# Patient Record
Sex: Male | Born: 1995 | Race: White | Hispanic: No | Marital: Single | Smoking: Never smoker
Health system: Southern US, Community
[De-identification: ages and names within clinical notes are randomized; demographics above are authoritative.]

## PROBLEM LIST (undated history)

## (undated) DIAGNOSIS — F32A Depression, unspecified: Secondary | ICD-10-CM

## (undated) DIAGNOSIS — F99 Mental disorder, not otherwise specified: Secondary | ICD-10-CM

## (undated) DIAGNOSIS — F329 Major depressive disorder, single episode, unspecified: Secondary | ICD-10-CM

---
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
## 2015-12-09 ENCOUNTER — Ambulatory Visit (HOSPITAL_COMMUNITY)
Admission: EM | Admit: 2015-12-09 | Discharge: 2015-12-09 | Disposition: A | Discharge status: Home / Self Care | Payer: Self-pay | Attending: Emergency Medicine | Admitting: Emergency Medicine

## 2015-12-09 ENCOUNTER — Encounter (HOSPITAL_COMMUNITY)
Admission: EM | Disposition: A | Discharge status: Home / Self Care | Payer: Self-pay | Source: Home / Self Care | Attending: Emergency Medicine

## 2015-12-09 ENCOUNTER — Emergency Department (HOSPITAL_COMMUNITY): Payer: Self-pay

## 2015-12-09 ENCOUNTER — Emergency Department (HOSPITAL_COMMUNITY): Payer: Self-pay | Admitting: Certified Registered Nurse Anesthetist

## 2015-12-09 ENCOUNTER — Encounter (HOSPITAL_COMMUNITY): Payer: Self-pay

## 2015-12-09 DIAGNOSIS — Z23 Encounter for immunization: Secondary | ICD-10-CM | POA: Insufficient documentation

## 2015-12-09 DIAGNOSIS — S62633B Displaced fracture of distal phalanx of left middle finger, initial encounter for open fracture: Secondary | ICD-10-CM | POA: Insufficient documentation

## 2015-12-09 DIAGNOSIS — W010XXA Fall on same level from slipping, tripping and stumbling without subsequent striking against object, initial encounter: Secondary | ICD-10-CM | POA: Insufficient documentation

## 2015-12-09 DIAGNOSIS — Y990 Civilian activity done for income or pay: Secondary | ICD-10-CM | POA: Insufficient documentation

## 2015-12-09 DIAGNOSIS — S6992XA Unspecified injury of left wrist, hand and finger(s), initial encounter: Secondary | ICD-10-CM

## 2015-12-09 HISTORY — PX: OPEN REDUCTION INTERNAL FIXATION (ORIF) METACARPAL: SHX6234

## 2015-12-09 SURGERY — OPEN REDUCTION INTERNAL FIXATION (ORIF) METACARPAL
Anesthesia: General | Site: Finger | Laterality: Left

## 2015-12-09 MED ORDER — FENTANYL CITRATE (PF) 100 MCG/2ML IJ SOLN: 25.0000 ug | INTRAMUSCULAR | Status: DC | PRN

## 2015-12-09 MED ORDER — MIDAZOLAM HCL 2 MG/2ML IJ SOLN
INTRAMUSCULAR | Status: AC
  Filled 2015-12-09: qty 2

## 2015-12-09 MED ORDER — LACTATED RINGERS IV SOLN
INTRAVENOUS | Status: DC | PRN
  Administered 2015-12-09 (×2): via INTRAVENOUS

## 2015-12-09 MED ORDER — CEFAZOLIN IN D5W 1 GM/50ML IV SOLN
1.0000 g | Freq: Once | INTRAVENOUS | Status: AC
  Administered 2015-12-09: 1 g via INTRAVENOUS
  Filled 2015-12-09: qty 50

## 2015-12-09 MED ORDER — OXYCODONE-ACETAMINOPHEN 5-325 MG PO TABS: 1.0000 | ORAL_TABLET | Freq: Three times a day (TID) | ORAL | 0 refills | Status: AC

## 2015-12-09 MED ORDER — MIDAZOLAM HCL 5 MG/5ML IJ SOLN
INTRAMUSCULAR | Status: DC | PRN
  Administered 2015-12-09: 2 mg via INTRAVENOUS

## 2015-12-09 MED ORDER — SUCCINYLCHOLINE CHLORIDE 20 MG/ML IJ SOLN
INTRAMUSCULAR | Status: DC | PRN
  Administered 2015-12-09: 140 mg via INTRAVENOUS

## 2015-12-09 MED ORDER — PROPOFOL 10 MG/ML IV BOLUS
INTRAVENOUS | Status: AC
  Filled 2015-12-09: qty 20

## 2015-12-09 MED ORDER — OXYCODONE HCL 5 MG/5ML PO SOLN: 5.0000 mg | Freq: Once | ORAL | Status: DC | PRN

## 2015-12-09 MED ORDER — ONDANSETRON HCL 4 MG/2ML IJ SOLN
INTRAMUSCULAR | Status: DC | PRN
  Administered 2015-12-09: 4 mg via INTRAVENOUS

## 2015-12-09 MED ORDER — TETANUS-DIPHTH-ACELL PERTUSSIS 5-2.5-18.5 LF-MCG/0.5 IM SUSP
0.5000 mL | Freq: Once | INTRAMUSCULAR | Status: AC
  Administered 2015-12-09: 0.5 mL via INTRAMUSCULAR
  Filled 2015-12-09: qty 0.5

## 2015-12-09 MED ORDER — FENTANYL CITRATE (PF) 100 MCG/2ML IJ SOLN
INTRAMUSCULAR | Status: AC
  Filled 2015-12-09: qty 4

## 2015-12-09 MED ORDER — 0.9 % SODIUM CHLORIDE (POUR BTL) OPTIME
TOPICAL | Status: DC | PRN
  Administered 2015-12-09: 1000 mL

## 2015-12-09 MED ORDER — CEPHALEXIN 500 MG PO CAPS: 500.0000 mg | ORAL_CAPSULE | Freq: Four times a day (QID) | ORAL | 0 refills | Status: AC

## 2015-12-09 MED ORDER — DEXAMETHASONE SODIUM PHOSPHATE 4 MG/ML IJ SOLN
INTRAMUSCULAR | Status: DC | PRN
  Administered 2015-12-09: 10 mg via INTRAVENOUS

## 2015-12-09 MED ORDER — DOCUSATE SODIUM 100 MG PO CAPS: 100.0000 mg | ORAL_CAPSULE | Freq: Two times a day (BID) | ORAL | 0 refills | Status: AC

## 2015-12-09 MED ORDER — LIDOCAINE HCL (CARDIAC) 20 MG/ML IV SOLN
INTRAVENOUS | Status: DC | PRN
  Administered 2015-12-09: 40 mg via INTRAVENOUS

## 2015-12-09 MED ORDER — HYDROMORPHONE HCL 2 MG/ML IJ SOLN
1.0000 mg | Freq: Once | INTRAMUSCULAR | Status: AC
  Administered 2015-12-09: 1 mg via INTRAVENOUS
  Filled 2015-12-09: qty 1

## 2015-12-09 MED ORDER — FENTANYL CITRATE (PF) 100 MCG/2ML IJ SOLN
INTRAMUSCULAR | Status: DC | PRN
  Administered 2015-12-09: 150 ug via INTRAVENOUS
  Administered 2015-12-09: 50 ug via INTRAVENOUS

## 2015-12-09 MED ORDER — ONDANSETRON HCL 4 MG/2ML IJ SOLN: 4.0000 mg | Freq: Once | INTRAMUSCULAR | Status: DC | PRN

## 2015-12-09 MED ORDER — PHENYLEPHRINE HCL 10 MG/ML IJ SOLN
INTRAMUSCULAR | Status: DC | PRN
  Administered 2015-12-09 (×4): 80 ug via INTRAVENOUS

## 2015-12-09 MED ORDER — BUPIVACAINE HCL (PF) 0.25 % IJ SOLN
INTRAMUSCULAR | Status: AC
  Filled 2015-12-09: qty 30

## 2015-12-09 MED ORDER — BUPIVACAINE HCL (PF) 0.25 % IJ SOLN
INTRAMUSCULAR | Status: DC | PRN
  Administered 2015-12-09: 6 mL

## 2015-12-09 MED ORDER — CEFAZOLIN SODIUM-DEXTROSE 2-3 GM-% IV SOLR
INTRAVENOUS | Status: DC | PRN
  Administered 2015-12-09: 2 g via INTRAVENOUS

## 2015-12-09 MED ORDER — OXYCODONE HCL 5 MG PO TABS: 5.0000 mg | ORAL_TABLET | Freq: Once | ORAL | Status: DC | PRN

## 2015-12-09 SURGICAL SUPPLY — 52 items
BANDAGE ACE 4X5 VEL STRL LF (GAUZE/BANDAGES/DRESSINGS) ×3 IMPLANT
BANDAGE ELASTIC 3 VELCRO ST LF (GAUZE/BANDAGES/DRESSINGS) ×3 IMPLANT
BNDG COHESIVE 1X5 TAN STRL LF (GAUZE/BANDAGES/DRESSINGS) ×3 IMPLANT
BNDG CONFORM 2 STRL LF (GAUZE/BANDAGES/DRESSINGS) ×3 IMPLANT
BNDG ESMARK 4X9 LF (GAUZE/BANDAGES/DRESSINGS) ×3 IMPLANT
BNDG GAUZE ELAST 4 BULKY (GAUZE/BANDAGES/DRESSINGS) ×3 IMPLANT
CORDS BIPOLAR (ELECTRODE) ×3 IMPLANT
COVER SURGICAL LIGHT HANDLE (MISCELLANEOUS) ×3 IMPLANT
CUFF TOURNIQUET SINGLE 18IN (TOURNIQUET CUFF) ×3 IMPLANT
DRAIN TLS ROUND 10FR (DRAIN) IMPLANT
DRAPE OEC MINIVIEW 54X84 (DRAPES) ×3 IMPLANT
DRAPE SURG 17X11 SM STRL (DRAPES) ×3 IMPLANT
DRSG ADAPTIC 3X8 NADH LF (GAUZE/BANDAGES/DRESSINGS) ×3 IMPLANT
DRSG EMULSION OIL 3X3 NADH (GAUZE/BANDAGES/DRESSINGS) ×3 IMPLANT
GAUZE SPONGE 4X4 12PLY STRL (GAUZE/BANDAGES/DRESSINGS) ×3 IMPLANT
GAUZE SPONGE 4X4 16PLY XRAY LF (GAUZE/BANDAGES/DRESSINGS) IMPLANT
GAUZE XEROFORM 1X8 LF (GAUZE/BANDAGES/DRESSINGS) ×3 IMPLANT
GLOVE BIO SURGEON STRL SZ7 (GLOVE) ×3 IMPLANT
GLOVE BIOGEL PI IND STRL 6.5 (GLOVE) ×1 IMPLANT
GLOVE BIOGEL PI IND STRL 8.5 (GLOVE) ×1 IMPLANT
GLOVE BIOGEL PI INDICATOR 6.5 (GLOVE) ×2
GLOVE BIOGEL PI INDICATOR 8.5 (GLOVE) ×2
GLOVE SURG ORTHO 8.0 STRL STRW (GLOVE) ×3 IMPLANT
GOWN STRL REUS W/ TWL LRG LVL3 (GOWN DISPOSABLE) ×3 IMPLANT
GOWN STRL REUS W/ TWL XL LVL3 (GOWN DISPOSABLE) ×1 IMPLANT
GOWN STRL REUS W/TWL LRG LVL3 (GOWN DISPOSABLE) ×6
GOWN STRL REUS W/TWL XL LVL3 (GOWN DISPOSABLE) ×2
K-WIRE 1.1 (WIRE) ×3 IMPLANT
KIT BASIN OR (CUSTOM PROCEDURE TRAY) ×3 IMPLANT
KIT ROOM TURNOVER OR (KITS) ×3 IMPLANT
KWIRE 1.1 (Wire) ×3 IMPLANT
MANIFOLD NEPTUNE II (INSTRUMENTS) ×3 IMPLANT
NEEDLE HYPO 25GX1X1/2 BEV (NEEDLE) ×3 IMPLANT
NEEDLE HYPO 25X1 1.5 SAFETY (NEEDLE) ×3 IMPLANT
NS IRRIG 1000ML POUR BTL (IV SOLUTION) ×3 IMPLANT
PACK ORTHO EXTREMITY (CUSTOM PROCEDURE TRAY) ×3 IMPLANT
PAD ARMBOARD 7.5X6 YLW CONV (MISCELLANEOUS) ×6 IMPLANT
PAD CAST 4YDX4 CTTN HI CHSV (CAST SUPPLIES) ×1 IMPLANT
PADDING CAST COTTON 4X4 STRL (CAST SUPPLIES) ×2
SOAP 2 % CHG 4 OZ (WOUND CARE) ×3 IMPLANT
SPLINT FINGER (SOFTGOODS) ×3 IMPLANT
SUT CHROMIC 5 0 P 3 (SUTURE) ×6 IMPLANT
SUT ETHILON 4 0 PS 2 18 (SUTURE) IMPLANT
SUT MNCRL AB 4-0 PS2 18 (SUTURE) IMPLANT
SUT VIC AB 2-0 FS1 27 (SUTURE) IMPLANT
SUT VICRYL 4-0 PS2 18IN ABS (SUTURE) IMPLANT
SYR CONTROL 10ML LL (SYRINGE) ×3 IMPLANT
SYSTEM CHEST DRAIN TLS 7FR (DRAIN) IMPLANT
TOWEL OR 17X24 6PK STRL BLUE (TOWEL DISPOSABLE) ×3 IMPLANT
TUBE CONNECTING 12'X1/4 (SUCTIONS) ×1
TUBE CONNECTING 12X1/4 (SUCTIONS) ×2 IMPLANT
YANKAUER SUCT BULB TIP NO VENT (SUCTIONS) IMPLANT

## 2015-12-09 NOTE — Discharge Instructions (Signed)
[facility]EEP [facility] CALL OFFICE FOR F/U APPT [phone] ALSO CALL FOR THERAPY APPT IN 7 DAYS [phone] EXT 1601 KEEP HAND ELEVATED ABOVE HEART OK TO APPLY ICE TO OPERATIVE AREA CONTACT OFFICE IF ANY WORSENING PAIN OR CONCERNS.

## 2015-12-09 NOTE — Anesthesia Preprocedure Evaluation (Signed)
Anesthesia Evaluation  Patient identified by MRN, date of birth, ID band Patient awake    Reviewed: Allergy & Precautions, NPO status , Patient's Chart, lab work & pertinent test results  Airway Mallampati: II  TM Distance: >3 FB Neck ROM: Full    Dental  (+) Teeth Intact, Dental Advisory Given   Pulmonary    breath sounds clear to auscultation       Cardiovascular  Rhythm:Regular Rate:Normal     Neuro/Psych    GI/Hepatic   Endo/Other    Renal/GU      Musculoskeletal   Abdominal   Peds  Hematology   Anesthesia Other Findings   Reproductive/Obstetrics                             Anesthesia Physical Anesthesia Plan  ASA: II and emergent  Anesthesia Plan: General   Post-op Pain Management:    Induction: Intravenous, Rapid sequence and Cricoid pressure planned  Airway Management Planned: Oral ETT  Additional Equipment:   Intra-op Plan:   Post-operative Plan: Extubation in OR  Informed Consent: I have reviewed the patients History and Physical, chart, labs and discussed the procedure including the risks, benefits and alternatives for the proposed anesthesia with the patient or authorized representative who has indicated his/her understanding and acceptance.   Dental advisory given  Plan Discussed with: CRNA and Anesthesiologist  Anesthesia Plan Comments:         Anesthesia Quick Evaluation

## 2015-12-09 NOTE — Transfer of Care (Signed)
Immediate Anesthesia Transfer of Care Note  Patient: Jose Young  Procedure(s) Performed: Procedure(s): OPEN REDUCTION INTERNAL FIXATION (ORIF) LONG FINGER VS AMPUTATION (Left)  Patient Location: PACU  Anesthesia Type:General  Level of Consciousness: awake, alert  and oriented  Airway & Oxygen Therapy: Patient Spontanous Breathing and Patient connected to nasal cannula oxygen  Post-op Assessment: Report given to RN and Post -op Vital signs reviewed and stable  Post vital signs: Reviewed and stable  Last Vitals:  Vitals:   12/09/15 1856 12/09/15 1857  BP: (!) 126/59   Pulse:    Resp:    Temp:  37.5 C    Last Pain: There were no vitals filed for this visit.       Complications: No apparent anesthesia complications

## 2015-12-09 NOTE — H&P (Signed)
Jose Young is an 20 y.o. male.   Chief Complaint: LEFT LONG FINGER INJURY HPI: PT WAS WORKING SUSTAINED LEFT LONG FINGER INJURY PRESENTED TO ED WITH LEFT LONG FINGER OPEN DISTAL TIP INJURY PT HERE FOR SURGERY NO PRIOR SURGERY TO LEFT LONG FINGER  Past Medical History:  Diagnosis Date  . Depression   . Mental disorder     History reviewed. No pertinent surgical history.  History reviewed. No pertinent family history. Social History:  reports that he has never smoked. He has never used smokeless tobacco. He reports that he does not drink alcohol. His drug history is not on file.  Allergies: No Known Allergies   (Not in a hospital admission)  No results found for this or any previous visit (from the past 48 hour(s)). Dg Hand Complete Left  Result Date: 12/09/2015 CLINICAL DATA:  Left hand injury at work. EXAM: LEFT HAND - COMPLETE 3+ VIEW COMPARISON:  None. FINDINGS: Comminuted and moderately angulated fracture is seen involving the third distal phalanx. Soft tissue laceration cannot be excluded. Possible mildly displaced avulsion fracture involving volar aspect of proximal base of third middle phalanx. Joint spaces are intact. No radiopaque foreign body is noted. IMPRESSION: Comminuted and moderately angulated third distal phalangeal fracture. Possible mildly displaced avulsion fracture involving volar aspect of proximal base of third middle phalanx. Electronically Signed   By: Lupita RaiderJames  Green Jr, M.D.   On: 12/09/2015 15:54    ROS: NO RECENT ILLNESSES OR HOSPITALIZATIONS  Blood pressure 131/81, pulse 110, resp. rate 17, SpO2 99 %. Physical Exam  General Appearance:  Alert, cooperative, no distress, appears stated age  Head:  Normocephalic, without obvious abnormality, atraumatic  Eyes:  Pupils equal, conjunctiva/corneas clear,         Throat: Lips, mucosa, and tongue normal; teeth and gums normal  Neck: No visible masses     Lungs:   respirations unlabored  Chest Wall:  No  tenderness or deformity  Heart:  Regular rate and rhythm,  Abdomen:   Soft, non-tender,         Extremities: LEFT LONG FINGER FINGER TIP PERFUSED OPEN DISTAL TUFT INJURY WITH EXPOSED BONE UNABLE TO FLEX DIP JOINT ABLE TO FLEX PIP JOINT SOFT TISSUE ABRASIONS TO DORSUM OF LONG AND INDEX FINGER NO WOUNDS TO THUMB/RING/SMALL GOOD WRIST AND FOREARM MOTION  Pulses: 2+ and symmetric  Skin: Skin color, texture, turgor normal, no rashes or lesions     Neurologic: Normal    Assessment/Plan LEFT LONG FINGER OPEN DISTAL PHALANX FRACTURE WITH NEAR AMPUTATION THROUGH DISTAL PHALANX  LEFT LONG FINGER OPEN DEBRIDEMENT AND ORIF VERSUS REVISION AMPUTATION  R/B/A DISCUSSED WITH PT IN HOSPITAL.  PT VOICED UNDERSTANDING OF PLAN CONSENT SIGNED DAY OF SURGERY PT SEEN AND EXAMINED PRIOR TO OPERATIVE PROCEDURE/DAY OF SURGERY SITE MARKED. QUESTIONS ANSWERED WILL GO HOME FOLLOWING SURGERY  WE ARE PLANNING SURGERY FOR YOUR UPPER EXTREMITY. THE RISKS AND BENEFITS OF SURGERY INCLUDE BUT NOT LIMITED TO BLEEDING INFECTION, DAMAGE TO NEARBY NERVES ARTERIES TENDONS, FAILURE OF SURGERY TO ACCOMPLISH ITS INTENDED GOALS, PERSISTENT SYMPTOMS AND NEED FOR FURTHER SURGICAL INTERVENTION. WITH THIS IN MIND WE WILL PROCEED. I HAVE DISCUSSED WITH THE PATIENT THE PRE AND POSTOPERATIVE REGIMEN AND THE DOS AND DON'TS. PT VOICED UNDERSTANDING AND INFORMED CONSENT SIGNED.  Sharma CovertORTMANN,Anmol Paschen W 12/09/2015, 5:18 PM

## 2015-12-09 NOTE — ED Provider Notes (Signed)
[facility]-EMERGENCY DEPT Provider Note   CSN: [id number] Arrival date & time: 12/09/15  1432     History   Chief Complaint Chief Complaint  Patient presents with  . Hand Injury    HPI Jose Young is a 20 y.o. male with past medical history of depression who presents to the ED today to be evaluated after a hand injury occurred. 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 This caused a crush injury to his left middle finger. Last tetanus unknown.  HPI  Past Medical History:  Diagnosis Date  . Depression   . Mental disorder     There are no active problems to display for this patient.   No past surgical history on file.     Home Medications    Prior to Admission medications   Medication Sig Start Date End Date Taking? Authorizing Provider  albuterol (PROVENTIL HFA;VENTOLIN HFA) 108 (90 BASE) MCG/ACT inhaler Inhale 2 puffs into the lungs every 6 (six) hours as needed for wheezing or shortness of breath.    Historical Provider, MD  clonazePAM (KLONOPIN) 0.5 MG tablet Take 1 tablet (0.5 mg total) by mouth 2 (two) times daily as needed for anxiety. 02/22/13   Niel Hummeross Kuhner, MD  GLUCOS-MSM-C-MN-GINGER-WILLOW PO Take 2 tablets by mouth every morning.    Historical Provider, MD  ibuprofen (ADVIL,MOTRIN) 200 MG tablet Take 200 mg by mouth every 6 (six) hours as needed for headache.    Historical Provider, MD    Family History No family history on file.  Social History Social History  Substance Use Topics  . Smoking status: Never Smoker  . Smokeless tobacco: Never Used  . Alcohol use No     Allergies   Patient has no known allergies.   Review of Systems Review of Systems  All other systems reviewed and are negative.    Physical Exam Updated Vital Signs There were no vitals taken for this visit.  Physical Exam  Constitutional: He is oriented  to person, place, and time. He appears well-developed and well-nourished. No distress.  HENT:  Head: Normocephalic and atraumatic.  Eyes: Conjunctivae are normal. Right eye exhibits no discharge. Left eye exhibits no discharge. No scleral icterus.  Cardiovascular: Normal rate.   Pulmonary/Chest: Effort normal.  Neurological: He is alert and oriented to person, place, and time. Coordination normal.  Skin: Skin is warm and dry. No rash noted. He is not diaphoretic. No erythema. No pallor.  Psychiatric: He has a normal mood and affect. His behavior is normal.  Nursing note and vitals reviewed.        ED Treatments / Results  Labs (all labs ordered are listed, but only abnormal results are displayed) Labs Reviewed - No data to display  EKG  EKG Interpretation None       Radiology Dg Hand Complete Left  Result Date: 12/09/2015 CLINICAL DATA:  Left hand injury at work. EXAM: LEFT HAND - COMPLETE 3+ VIEW COMPARISON:  None. FINDINGS: Comminuted and moderately angulated fracture is seen involving the third distal phalanx. Soft tissue laceration cannot be excluded. Possible mildly displaced avulsion fracture involving volar aspect of proximal base of third middle phalanx. Joint spaces are intact. No radiopaque foreign body is noted. IMPRESSION: Comminuted and moderately angulated third distal phalangeal fracture. Possible mildly displaced avulsion fracture involving volar aspect of proximal base of third middle phalanx. Electronically Signed  By: Lupita RaiderJames  Green Jr, M.D.   On: 12/09/2015 15:54    Procedures Procedures (including critical care time)  Medications Ordered in ED Medications  ceFAZolin (ANCEF) IVPB 1 g/50 mL premix (1 g Intravenous New Bag/Given 12/09/15 1504)  Tdap (BOOSTRIX) injection 0.5 mL (not administered)  HYDROmorphone (DILAUDID) injection 1 mg (1 mg Intravenous Given 12/09/15 1447)     Initial Impression / Assessment and Plan / ED Course  I have reviewed the  triage vital signs and the nursing notes.  Pertinent labs & imaging results that were available during my care of the patient were reviewed by me and considered in my medical decision making (see chart for details).  Clinical Course    Otherwise healthy 20 year old male presents to the ED with near complete amputation of distal aspect of left middle finger. Injury occurred less than one hour prior to arrival. Tetanus updated in the ED. Patient given pain medication and 1 g of Ancef. Wound was cleaned with iodine and sterile water. Spoke with Dr. Melvyn Novasrtmann with hand he will take patient for surgery. Patient currently hemodynamically stable.   Final Clinical Impressions(s) / ED Diagnoses   Final diagnoses:  None    New Prescriptions New Prescriptions   No medications on file     Dub MikesSamantha Tripp Chinonso Linker, PA-C 12/09/15 1930    Shaune Pollackameron Isaacs, MD 12/10/15 [id number]

## 2015-12-09 NOTE — ED Notes (Signed)
Patient transported to X-ray 

## 2015-12-09 NOTE — Anesthesia Procedure Notes (Signed)
Procedure Name: Intubation Date/Time: 12/09/2015 5:34 PM Performed by: Reine JustFLOWERS, Toula Miyasaki T Pre-anesthesia Checklist: Patient identified, Emergency Drugs available, Suction available, Patient being monitored and Timeout performed Patient Re-evaluated:Patient Re-evaluated prior to inductionOxygen Delivery Method: Circle system utilized and Simple face mask Preoxygenation: Pre-oxygenation with 100% oxygen Intubation Type: IV induction, Rapid sequence and Cricoid Pressure applied Ventilation: Mask ventilation without difficulty Laryngoscope Size: Miller and 3 Grade View: Grade I Tube type: Oral Tube size: 7.5 mm Number of attempts: 1 Airway Equipment and Method: Patient positioned with wedge pillow and Stylet Placement Confirmation: ETT inserted through vocal cords under direct vision,  positive ETCO2 and breath sounds checked- equal and bilateral Secured at: 21 cm Tube secured with: Tape Dental Injury: Teeth and Oropharynx as per pre-operative assessment

## 2015-12-09 NOTE — ED Triage Notes (Signed)
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  Deep laceration present on his left middle finger with trauma to the nail bed, he has a small laceration on his left pointer finger.  #16 RAC with 400 NS, 50 mcg of fentanyl received in route.  Pain went from a 10/10 to a 2/10. Bleeding is controlled at this time with gauze.

## 2015-12-11 ENCOUNTER — Encounter (HOSPITAL_COMMUNITY): Payer: Self-pay | Admitting: Orthopedic Surgery

## 2015-12-11 NOTE — Anesthesia Postprocedure Evaluation (Signed)
Anesthesia Post Note  Patient: Jose Young  Procedure(s) Performed: Procedure(s) (LRB): OPEN REDUCTION INTERNAL FIXATION (ORIF) LONG FINGER VS AMPUTATION (Left)  Patient location during evaluation: PACU Anesthesia Type: General Level of consciousness: awake Pain management: pain level controlled Vital Signs Assessment: post-procedure vital signs reviewed and stable Respiratory status: spontaneous breathing Cardiovascular status: stable Postop Assessment: no signs of nausea or vomiting Anesthetic complications: no    Last Vitals:  Vitals:   12/09/15 1900 12/09/15 1915  BP: 124/84 118/75  Pulse: (!) 127 (!) 132  Resp: (!) 29 (!) 24  Temp:  37.5 C    Last Pain: There were no vitals filed for this visit.               Madylyn Insco

## 2015-12-12 NOTE — Op Note (Signed)
NAMRennie Plowman:  Jaffe, Gilles              ACCOUNT NO.:  [id number]  MEDICAL RECORD NO.:  [id number]  LOCATION:                                 FACILITY:  PHYSICIAN:  Sharma CovertFred W. Micheal Murad IV, M.D.DATE OF BIRTH:  February 09, 1995  DATE OF PROCEDURE:  12/09/2015 DATE OF DISCHARGE:                              OPERATIVE REPORT   PREOPERATIVE DIAGNOSIS:  Left long finger open distal phalanx fracture.  POSTOPERATIVE DIAGNOSIS:  Left long finger open distal phalanx fracture.  ATTENDING PHYSICIAN:  Sharma CovertFred W. Karlisha Mathena, M.D. was scrubbed and was present for the entire procedure.  ASSISTANT SURGEON:  None.  ANESTHESIA:  General via LMA.  PROCEDURE: 1. Open debridement of skin and subcutaneous tissue and bone     associated with left long finger open distal phalanx fracture. 2. Left long finger open treatment of distal phalanx fracture     requiring internal fixation. 3. Repair of left long finger nail bed. 4. Radiographs, 2 views, left long finger. 5. Left long finger wound closure of complex wound laceration, 3 cm.  RADIOGRAPHIC INTERPRETATION:  AP. lateral, and oblique views of the finger did show the K-wire fixation in place.  There was good alignment with a comminuted distal tuft fracture.  SURGICAL IMPLANTS:  One 0.045 K-wire.  SURGICAL INDICATIONS:  Mr. Freddy JakschKroush is a right-hand-dominant gentleman who sustained the injury while at work to the left long finger.  The patient was seen and evaluated in the ER and recommend to undergo the above procedure.  Risks, benefits, and alternatives were discussed in detail with the patient, and signed informed consent was obtained.  Risks include, but not limited to bleeding, infection, damage to nearby nerves, arteries, or tendons; nonunion, malunion, hardware failure, loss of motion wrist and digits, incomplete relief of symptoms, and need for further surgical intervention.  DESCRIPTION OF PROCEDURE:  The patient was properly identified in the preoperative  holding area and marked with a permanent marker made on followup left long finger to indicate the correct operative site.  The patient was then brought back to the operating room, placed supine on anesthesia table where general anesthetic was administered. Preoperative antibiotics were given.  A well-padded tourniquet placed on left brachium and sealed with 1000 drape.  The left upper extremity was prepped and draped in normal sterile fashion.  Time-out was called.  The correct site was identified, and the procedure was then begun. Attention was then turned to the left long finger.  Excisional debridement of the skin and subcutaneous tissue was then carried out. After the tourniquet was insufflated, devitalized skin subcutaneous tissue and bone was then carried out.  Removed sharply with scalpel, rongeur, and curettes.  Following the thorough excisional debridement, the wound was then irrigated.  Following this, a 0.045 K-wire was then placed in the greater than back retrograde across the fracture site across the segmental distal phalanx fracture with good purchase across the distal interphalangeal joint.  The wound was irrigated.  K-wire was then cut and left out of the skin after confirmation with the use of the mini C-arm.  After open treatment of the fracture back cut was then made in the eponychium to allow for nail bed repair.  This was done with the comminuted nail bed complex nail bed laceration repair with the 5-0 chromic suture.  After excisional debridement of the skin.  The traumatic laceration was then closed with chromic sutures.  The nail plate had been removed and this was placed back on the nail bed to protect for repair.  The Adaptic dressing and sterile compressive bandage were applied.  The K-wire had been cut and left out of the skin and the tourniquet deflated.  There was good perfusion appeared to be perfusing through the ulnar neurovascular bundle.  The Adaptic  and Xeroform dressings were applied.  The patient was then placed in a sterile compressive bandage, a finger splint, and extubated and taken to recovery room in good condition.  POSTPROCEDURE PLAN:  The patient was discharged to home, seen back in the office in 1 week for wound check and see therapist for tip protector splint for the long finger x-rays.  K-wire in for a total 4 to 5 weeks. Radiographs at each visit.  Increase his activity as we progress with the fracture healing and wound healing.     Madelynn DoneFred W. Tyrail Grandfield IV, M.D.   ______________________________ Madelynn DoneFred W. Rushi Chasen IV, M.D.    FWO/MEDQ  D:  12/09/2015  T:  12/09/2015  Job:  [id number]

## 2017-09-20 IMAGING — DX DG HAND COMPLETE 3+V*L*
3 series · 3 of 3 positions shown · non-contrast
Comparison: None.

CLINICAL DATA: Left hand injury at work.

EXAM:
LEFT HAND - COMPLETE 3+ VIEW

[hand pa]
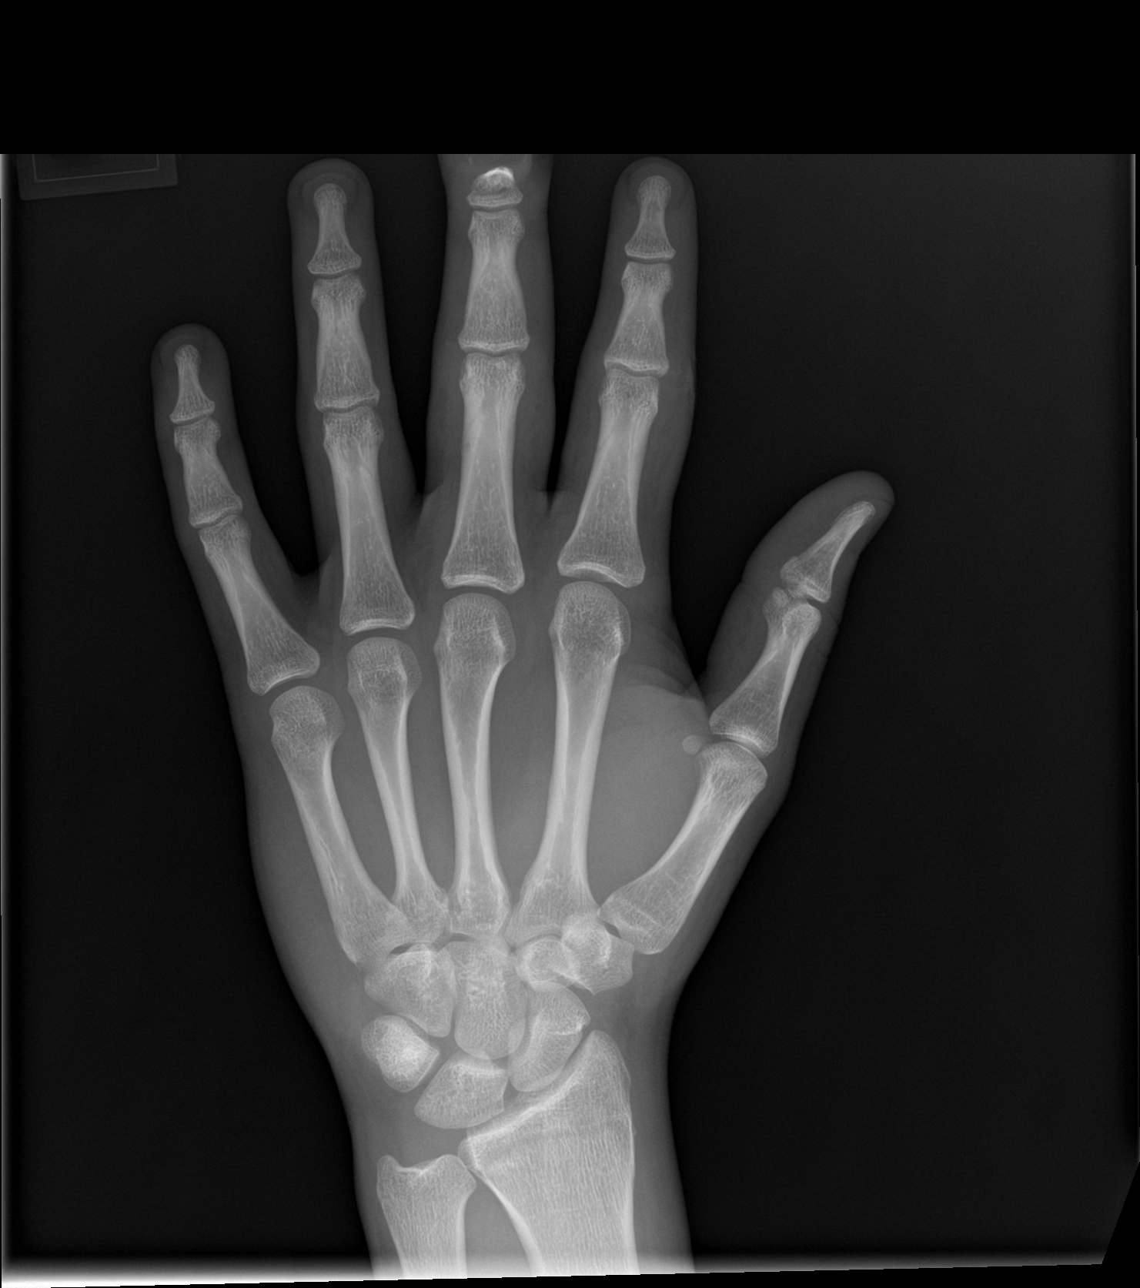

[hand obl]
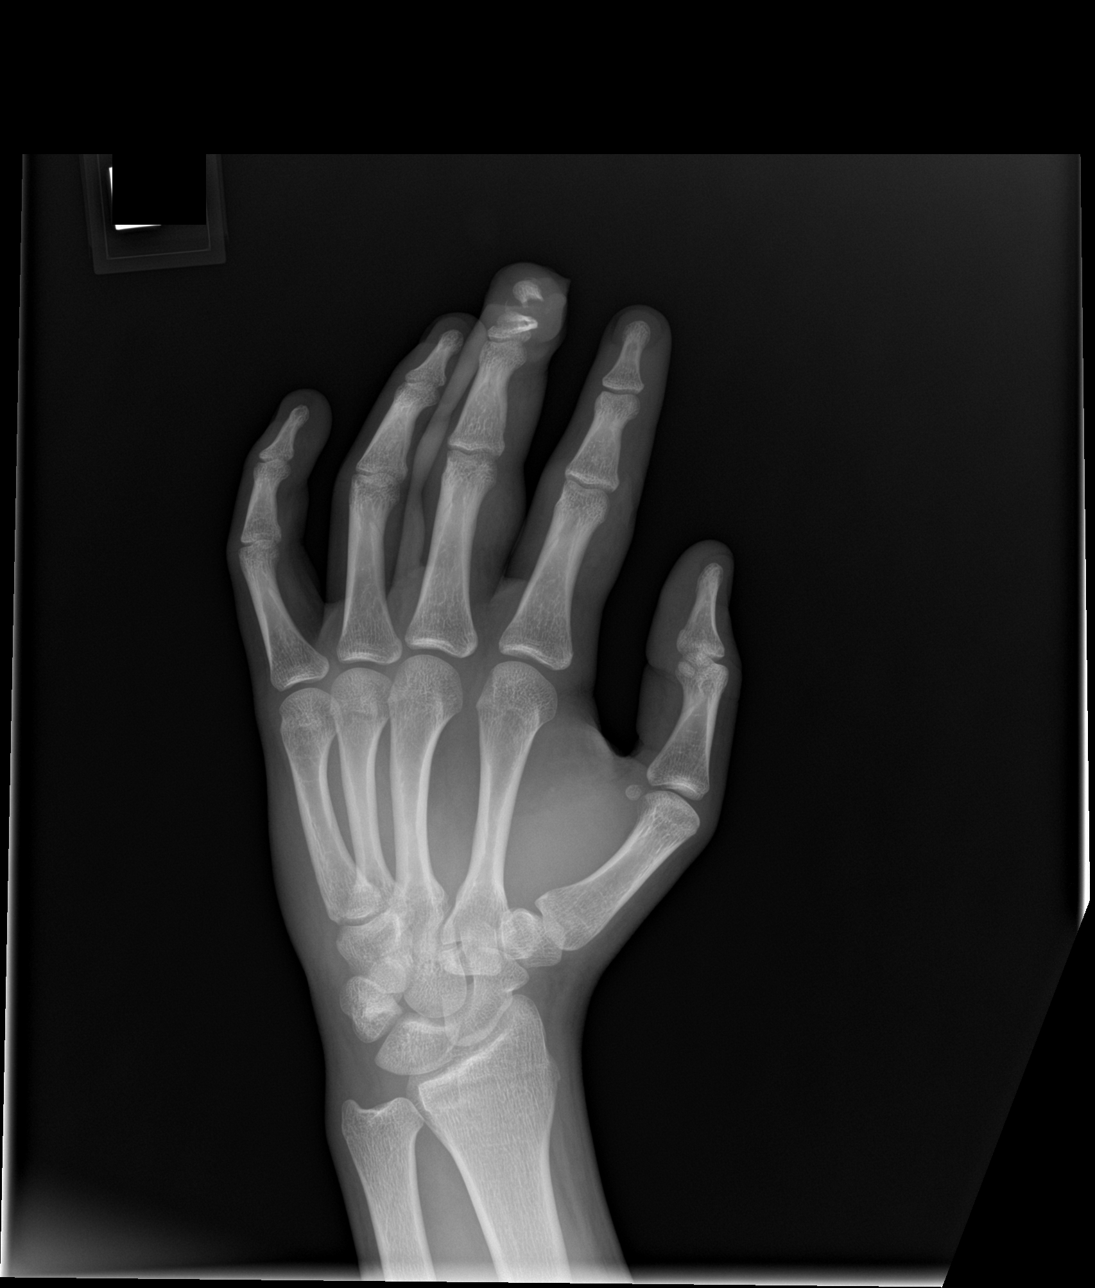

[hand lat]
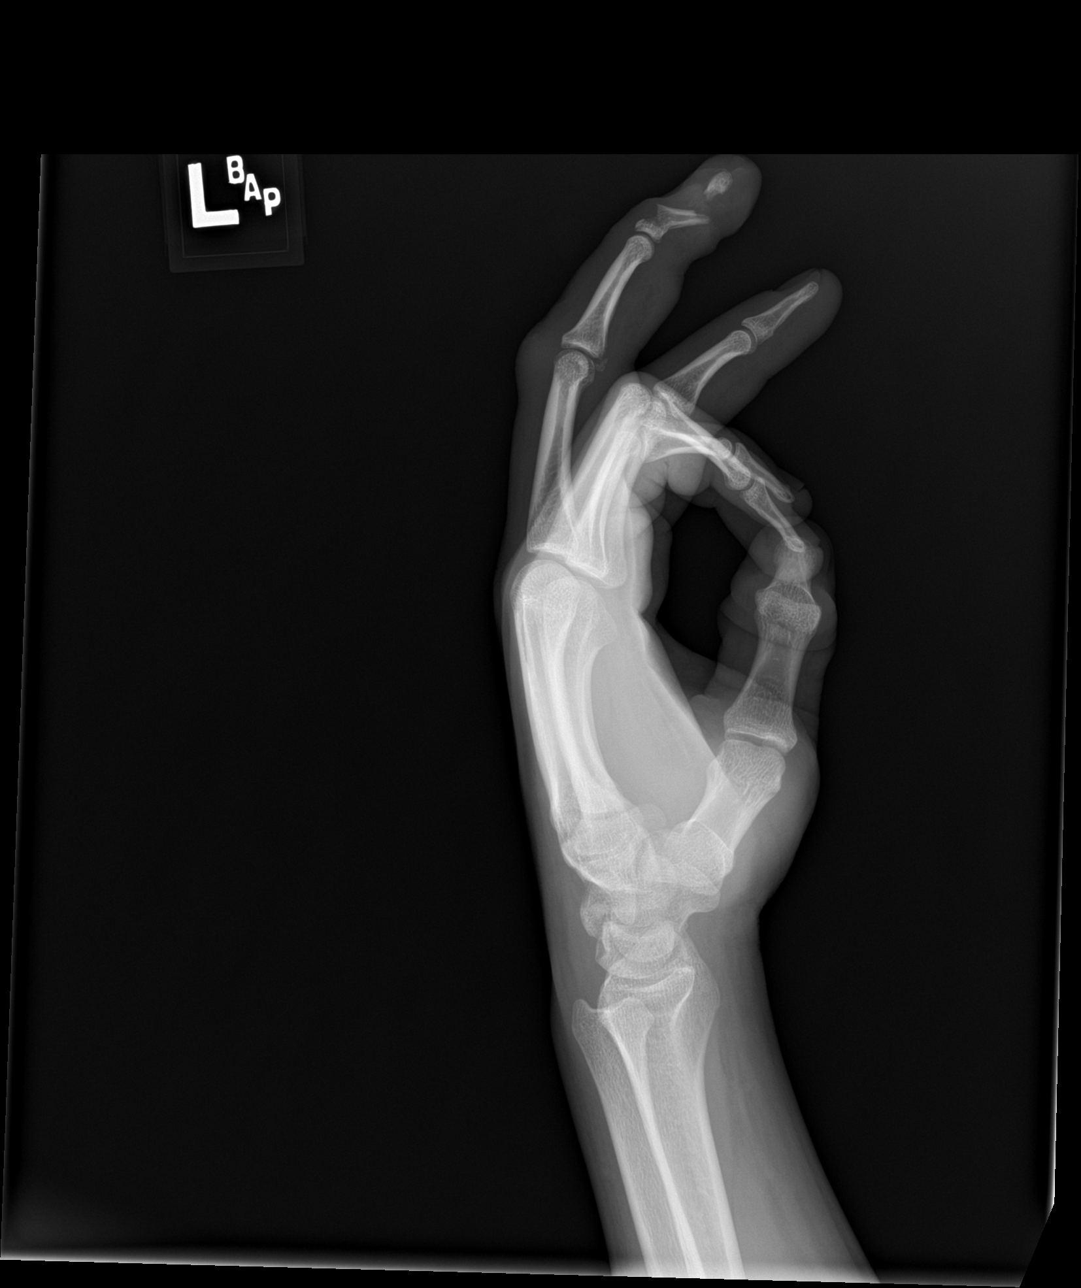

[3 of 3 positions shown; findings below may reference images not displayed]

FINDINGS: Comminuted and moderately angulated fracture is seen involving the
third distal phalanx. Soft tissue laceration cannot be excluded.
Possible mildly displaced avulsion fracture involving volar aspect
of proximal base of third middle phalanx. Joint spaces are intact.
No radiopaque foreign body is noted.
IMPRESSION: Comminuted and moderately angulated third distal phalangeal
fracture. Possible mildly displaced avulsion fracture involving
volar aspect of proximal base of third middle phalanx.
